# Patient Record
Sex: Female | Born: 1960 | Hispanic: No | Marital: Married | Smoking: Never smoker
Health system: Southern US, Community
[De-identification: ages and names within clinical notes are randomized; demographics above are authoritative.]

## PROBLEM LIST (undated history)

## (undated) DIAGNOSIS — I10 Essential (primary) hypertension: Secondary | ICD-10-CM

---

## 2014-01-14 ENCOUNTER — Emergency Department (HOSPITAL_COMMUNITY)
Admission: EM | Admit: 2014-01-14 | Discharge: 2014-01-15 | Disposition: A | Discharge status: Home / Self Care | Payer: BC Managed Care – PPO | Attending: Emergency Medicine | Admitting: Emergency Medicine

## 2014-01-14 ENCOUNTER — Encounter (HOSPITAL_COMMUNITY): Payer: Self-pay | Admitting: Emergency Medicine

## 2014-01-14 DIAGNOSIS — R1013 Epigastric pain: Secondary | ICD-10-CM | POA: Insufficient documentation

## 2014-01-14 DIAGNOSIS — Z880 Allergy status to penicillin: Secondary | ICD-10-CM | POA: Insufficient documentation

## 2014-01-14 DIAGNOSIS — R109 Unspecified abdominal pain: Secondary | ICD-10-CM

## 2014-01-14 DIAGNOSIS — I10 Essential (primary) hypertension: Secondary | ICD-10-CM | POA: Insufficient documentation

## 2014-01-14 DIAGNOSIS — R319 Hematuria, unspecified: Secondary | ICD-10-CM | POA: Insufficient documentation

## 2014-01-14 HISTORY — DX: Essential (primary) hypertension: I10

## 2014-01-14 NOTE — ED Notes (Signed)
Pt c/o L flank pain onset 1800 tonight, denies n/v, denies urinary s/s

## 2014-01-15 ENCOUNTER — Emergency Department (HOSPITAL_COMMUNITY): Payer: BC Managed Care – PPO

## 2014-01-15 LAB — BASIC METABOLIC PANEL
BUN: 22 mg/dL (ref 6–23)
CALCIUM: 9.3 mg/dL (ref 8.4–10.5)
CO2: 24 meq/L (ref 19–32)
CREATININE: 1.14 mg/dL — AB (ref 0.50–1.10)
Chloride: 100 mEq/L (ref 96–112)
GFR calc Af Amer: 62 mL/min — ABNORMAL LOW (ref >90)
GFR, EST NON AFRICAN AMERICAN: 54 mL/min — AB (ref >90)
GLUCOSE: 105 mg/dL — AB (ref 70–99)
Potassium: 3.6 mEq/L — ABNORMAL LOW (ref 3.7–5.3)
SODIUM: 138 meq/L (ref 137–147)

## 2014-01-15 LAB — URINALYSIS, ROUTINE W REFLEX MICROSCOPIC
BILIRUBIN URINE: NEGATIVE
Glucose, UA: NEGATIVE mg/dL
KETONES UR: NEGATIVE mg/dL
Leukocytes, UA: NEGATIVE
NITRITE: NEGATIVE
Protein, ur: 30 mg/dL — AB
Specific Gravity, Urine: 1.009 (ref 1.005–1.030)
UROBILINOGEN UA: 0.2 mg/dL (ref 0.0–1.0)
pH: 5.5 (ref 5.0–8.0)

## 2014-01-15 LAB — CBC
HCT: 36.3 % (ref 36.0–46.0)
Hemoglobin: 12.5 g/dL (ref 12.0–15.0)
MCH: 29 pg (ref 26.0–34.0)
MCHC: 34.4 g/dL (ref 30.0–36.0)
MCV: 84.2 fL (ref 78.0–100.0)
PLATELETS: 198 10*3/uL (ref 150–400)
RBC: 4.31 MIL/uL (ref 3.87–5.11)
RDW: 13 % (ref 11.5–15.5)
WBC: 8.3 10*3/uL (ref 4.0–10.5)

## 2014-01-15 LAB — URINE MICROSCOPIC-ADD ON

## 2014-01-15 LAB — LIPASE, BLOOD: LIPASE: 30 U/L (ref 11–59)

## 2014-01-15 LAB — TROPONIN I

## 2014-01-15 MED ORDER — SODIUM CHLORIDE 0.9 % IV BOLUS (SEPSIS)
1000.0000 mL | Freq: Once | INTRAVENOUS | Status: AC
  Administered 2014-01-15: 1000 mL via INTRAVENOUS

## 2014-01-15 MED ORDER — SODIUM CHLORIDE 0.9 % IV SOLN
80.0000 mg | Freq: Once | INTRAVENOUS | Status: AC
  Administered 2014-01-15: 80 mg via INTRAVENOUS
  Filled 2014-01-15: qty 80

## 2014-01-15 MED ORDER — MORPHINE SULFATE 4 MG/ML IJ SOLN
4.0000 mg | Freq: Once | INTRAMUSCULAR | Status: AC
  Administered 2014-01-15: 4 mg via INTRAVENOUS
  Filled 2014-01-15: qty 1

## 2014-01-15 MED ORDER — ASPIRIN 81 MG PO CHEW
324.0000 mg | CHEWABLE_TABLET | Freq: Once | ORAL | Status: AC
  Administered 2014-01-15: 324 mg via ORAL
  Filled 2014-01-15: qty 4

## 2014-01-15 NOTE — ED Notes (Signed)
EDP with patient at this time 

## 2014-01-15 NOTE — ED Notes (Signed)
Pt transported xray.  

## 2014-01-15 NOTE — ED Provider Notes (Signed)
Medical screening examination/treatment/procedure(s) were conducted as a shared visit with non-physician practitioner(s) or resident  and myself.  I personally evaluated the patient during the encounter and agree with the findings and plan unless otherwise indicated.    I have personally reviewed any xrays and/ or EKG's with the provider and I agree with interpretation.   Patient with worsening left flank pain since this evening, no history of similar, no history of kidney stones, C-section history. On exam patient has soft nontender abdomen with very mild left mid flank tenderness. CT scan done to look for kidney stone showed likely recent passage of stone. Pain controlled in ER.  Labs Reviewed  URINALYSIS, ROUTINE W REFLEX MICROSCOPIC - Abnormal; Notable for the following:    Hgb urine dipstick SMALL (*)    Protein, ur 30 (*)    All other components within normal limits  BASIC METABOLIC PANEL - Abnormal; Notable for the following:    Potassium 3.6 (*)    Glucose, Bld 105 (*)    Creatinine, Ser 1.14 (*)    GFR calc non Af Amer 54 (*)    GFR calc Af Amer 62 (*)    All other components within normal limits  URINE MICROSCOPIC-ADD ON - Abnormal; Notable for the following:    Squamous Epithelial / LPF FEW (*)    Bacteria, UA FEW (*)    All other components within normal limits  URINE CULTURE  CBC  LIPASE, BLOOD  TROPONIN I     Enid SkeensJoshua M Zavitz, MD 01/15/14 [phone]

## 2014-01-15 NOTE — ED Provider Notes (Signed)
CSN: [id number]     Arrival date & time 01/14/14  2334 History   First MD Initiated Contact with Patient 01/15/14 0022     Chief Complaint  Patient presents with  . Flank Pain     (Consider location/radiation/quality/duration/timing/severity/associated sxs/prior Treatment) Patient is a 53 y.o. female presenting with flank pain. The history is provided by the patient, medical records and a relative. No language interpreter was used.  Flank Pain Associated symptoms include abdominal pain and chest pain. Pertinent negatives include no coughing, diaphoresis, fatigue, fever, headaches, nausea, rash or vomiting.    Kerry Manning is a 53 y.o. female  with a hx of HTN presents to the Emergency Department complaining of gradual, persistent, progressively worsening left flank pain, epigastric pain, indigestion onset 6PM tonight.  Pt reports her pain is burning in nature, rated at a 6/10 and located mostly in the epigastrium without radiation. She took Prilosec PTA without relief.  No aggravating or alleviating factors. Pt denies fever, chills, headache, neck pain, SOB, N/V/D, weakness, dizziness, diaphoresis, syncope, dysuria, hematuria.     Past Medical History  Diagnosis Date  . Hypertension    Past Surgical History  Procedure Laterality Date  . Cesarean section     No family history on file. History  Substance Use Topics  . Smoking status: Never Smoker   . Smokeless tobacco: Not on file  . Alcohol Use: No   OB History   Grav Para Term Preterm Abortions TAB SAB Ect Mult Living                 Review of Systems  Constitutional: Negative for fever, diaphoresis, appetite change, fatigue and unexpected weight change.  HENT: Negative for mouth sores.   Eyes: Negative for visual disturbance.  Respiratory: Negative for cough, chest tightness, shortness of breath and wheezing.   Cardiovascular: Positive for chest pain.  Gastrointestinal: Positive for abdominal pain. Negative for  nausea, vomiting, diarrhea and constipation.  Endocrine: Negative for polydipsia, polyphagia and polyuria.  Genitourinary: Positive for flank pain. Negative for dysuria, urgency, frequency and hematuria.  Musculoskeletal: Negative for back pain and neck stiffness.  Skin: Negative for rash.  Allergic/Immunologic: Negative for immunocompromised state.  Neurological: Negative for syncope, light-headedness and headaches.  Hematological: Does not bruise/bleed easily.  Psychiatric/Behavioral: Negative for sleep disturbance. The patient is not nervous/anxious.       Allergies  Penicillins  Home Medications   Prior to Admission medications   Not on File   BP 113/67  Pulse 79  Temp(Src) 98.6 F (37 C) (Oral)  Resp 14  Wt 124 lb (56.246 kg)  SpO2 95% Physical Exam  Nursing note and vitals reviewed. Constitutional: She is oriented to person, place, and time. She appears well-developed and well-nourished. No distress.  Awake, alert, nontoxic appearance  HENT:  Head: Normocephalic and atraumatic.  Mouth/Throat: Oropharynx is clear and moist. No oropharyngeal exudate.  Eyes: Conjunctivae are normal. No scleral icterus.  Neck: Normal range of motion. Neck supple.  Cardiovascular: Normal rate, regular rhythm, normal heart sounds and intact distal pulses.   No murmur heard. Pulmonary/Chest: Effort normal and breath sounds normal. No respiratory distress. She has no wheezes.  Abdominal: Soft. Bowel sounds are normal. She exhibits no mass. There is tenderness in the epigastric area. There is CVA tenderness (bilateral). There is no rebound and no guarding.  Musculoskeletal: Normal range of motion. She exhibits no edema.  Neurological: She is alert and oriented to person, place, and time. Coordination normal.  Speech  is clear and goal oriented Moves extremities without ataxia  Skin: Skin is warm and dry. She is not diaphoretic. No erythema.  Psychiatric: She has a normal mood and affect.     ED Course  Procedures (including critical care time) Labs Review Labs Reviewed  URINALYSIS, ROUTINE W REFLEX MICROSCOPIC - Abnormal; Notable for the following:    Hgb urine dipstick SMALL (*)    Protein, ur 30 (*)    All other components within normal limits  BASIC METABOLIC PANEL - Abnormal; Notable for the following:    Potassium 3.6 (*)    Glucose, Bld 105 (*)    Creatinine, Ser 1.14 (*)    GFR calc non Af Amer 54 (*)    GFR calc Af Amer 62 (*)    All other components within normal limits  URINE MICROSCOPIC-ADD ON - Abnormal; Notable for the following:    Squamous Epithelial / LPF FEW (*)    Bacteria, UA FEW (*)    All other components within normal limits  URINE CULTURE  CBC  LIPASE, BLOOD  TROPONIN I    Imaging Review Ct Abdomen Pelvis Wo Contrast  01/15/2014   CLINICAL DATA:  Left flank pain  EXAM: CT ABDOMEN AND PELVIS WITHOUT CONTRAST  TECHNIQUE: Multidetector CT imaging of the abdomen and pelvis was performed following the standard protocol without IV contrast.  COMPARISON:  None.  FINDINGS: Renal: There is bilateral perinephric stranding. Small amount of fluid in the posterior perirenal space on the left (image 33 series 2 No hydronephrosis or hydroureter. No nephrolithiasis or ureterolithiasis. The  Lung bases are clear.  No pericardial fluid.  Low-density lesion in the high left hepatic lobe has simple fluid attenuation. The gallbladder, pancreas, spleen, adrenal glands are normal.  Stomach, small bowel, appendix, cecum are normal. The colon and rectosigmoid colon are normal.  Abdominal or is normal caliber. No retroperitoneal periportal lymphadenopathy.  No free fluid the pelvis. No distal ureteral stones or bladder stones. Multiple vascular calcifications are present within the pelvis. The uterus and ovaries are normal. No pelvic lymphadenopathy. No aggressive osseous lesion.  IMPRESSION: Bilateral perinephric stranding without hydronephrosis, hydroureter, or  ureterolithiasis is likely chronic. There is a small amount of fluid in the left posterior perirenal space therefore cannot exclude a passed stone on the left.   Electronically Signed   By: Genevive BiStewart  Edmunds M.D.   On: 01/15/2014 01:58   Dg Chest 2 View  01/15/2014   CLINICAL DATA:  Left-sided chest pain, abdominal pain  EXAM: CHEST  2 VIEW  COMPARISON:  None.  FINDINGS: Normal mediastinum and cardiac silhouette. Normal pulmonary vasculature. No evidence of effusion, infiltrate, or pneumothorax. No acute bony abnormality.  IMPRESSION: No acute cardiopulmonary process.   Electronically Signed   By: Genevive BiStewart  Edmunds M.D.   On: 01/15/2014 02:00     EKG Interpretation None      ECG:  Date: 01/15/2014  Rate: 81  Rhythm: normal sinus rhythm  QRS Axis: normal  Intervals: normal  ST/T Wave abnormalities: normal  Conduction Disutrbances:none  Narrative Interpretation: Nonischemic ECG, no old for comparison  Old EKG Reviewed: none available    MDM   Final diagnoses:  Flank pain  Hematuria  Epigastric pain   Kerry Manning presents with left flank pain, epigastric pain, indigestion and CP beginning at [employer]6PM tonight.  HEART score 3.    4:09 AM Patient with complete resolution of symptoms after treatment with aspirin morphine and Protonix.  Troponin normal, lipase normal. CBC without  leukocytosis and BMP with slightly elevated creatinine 1.14 without baseline for patient. Urine with small amount of hemoglobin noted no evidence of urinary tract infection.  Chest x-ray without pulmonary edema, pneumothorax or evidence of pneumonia. CT abdomen pelvis with small amount of fluid in the left posterior perirenal space likely secondary to a passed stone on the left.  This location of patient's initial pain.  Patient is to be discharged with recommendation to follow up with PCP in regards to today's hospital visit. Chest pain is not likely of cardiac or pulmonary etiology d/t presentation, PERC  negative, VSS, no tracheal deviation, no JVD or new murmur, RRR, breath sounds equal bilaterally, EKG without acute abnormalities, negative troponin, and negative CXR. Pt has been advised  to continue her  PPI and return to the ED if CP becomes exertional, associated with diaphoresis or nausea, radiates to left jaw/arm, worsens or becomes concerning in any way. Also recommend followup with urology for further discussion of nephrolithiasis. Pt appears reliable for follow up and is agreeable to discharge.   I have personally reviewed patient's vitals, nursing note and any pertinent labs or imaging.  I performed an undressed physical exam.    At this time, it has been determined that no acute conditions requiring further emergency intervention. The patient/guardian have been advised of the diagnosis and plan. I reviewed all labs and imaging including any potential incidental findings. We have discussed signs and symptoms that warrant return to the ED, such as those listed above .  Patient/guardian has voiced understanding and agreed to follow-up with the PCP or specialist in 3 days .  Vital signs are stable at discharge.   BP 113/67  Pulse 79  Temp(Src) 98.6 F (37 C) (Oral)  Resp 14  Wt 124 lb (56.246 kg)  SpO2 95%   Case has been discussed with and seen by Dr. Jodi MourningZavitz who agrees with the above plan to discharge.         Dierdre ForthHannah Muthersbaugh, PA-C 01/15/14 0413  Dierdre ForthHannah Muthersbaugh, PA-C 01/15/14 [id number]

## 2014-01-15 NOTE — Discharge Instructions (Signed)
1. Medications: usual home medications 2. Treatment: rest, drink plenty of fluids,  3. Follow Up: Please followup with your primary doctor in 3 days for discussion of your diagnoses and further evaluation after today's visit; if you do not have a primary care doctor use the resource guide provided to find one;    Clculos renales (Kidney Stones) Los clculos renales (urolitiasis) son masas slidas que se forman en el interior de los riones. El dolor intenso es causado por el movimiento de la piedra a travs del tracto urinario. Cuando la piedra se mueve, el urter hace un espasmo alrededor de la misma. El clculo generalmente se elimina con la [place].  CAUSAS   Un trastorno que hace que ciertas glndulas del cuello produzcan demasiada hormona paratiroidea (hiperparatiroidismo primario).  Una acumulacin de cristales de cido rico, similar a Brewing technologistla gota en las articulaciones.  Estrechamiento (constriccin) del urter.  Obstruccin en el rin presente al nacer (obstruccin congnita).  Cirugas previas del rin o los urteres.  Numerosas infecciones renales. SNTOMAS   Ganas de vomitar (nuseas).  Devolver la comida (vomitar).  Sangre en la orina (hematuria).  Dolor que generalmente se expande (irradia) hacia la ingle.  Ganas de orinar con frecuencia o de manera urgente. DIAGNSTICO   Historia clnica y examen fsico.  Anlisis de sangre y [place].  Tomografa computada.  En algunos casos se realiza un examen del interior de la vejiga (citoscopa). TRATAMIENTO   Observacin.  Aumentar la ingesta de lquidos.  Litotricia extracorprea con ondas de choque: es un procedimiento no invasivo que [place]util[place] ondas de cho[place] para romper los clculos renales.  Ser necesaria la ciruga si tiene dolor muy intenso o la obstruccin persiste. Hay varios procedimientos quirrgicos. La mayora de los procedimientos se realizan con el uso de pequeos instrumentos. Slo es Passenger transport managernecesario realizar  pequeas incisiones para acomodar estos instrumentos, por lo tanto el tiempo de recuperacin es mnimo. El tamao, la ubicacin y la composicin qumica de los clculos son variables importantes que determinarn la eleccin correcta de tratamiento para su caso. Comunquese con su mdico para comprender mejor su situacin, de modo que pueda ArvinMeritorminimizar los riesgos de lesiones para usted y su rin.  INSTRUCCIONES PARA EL CUIDADO EN EL HOGAR   Beba gran cantidad de lquido para mantener la orina de tono claro o color amarillo plido. Esto ayudar a eliminar las piedras o los fragmentos.  Cuele la orina con el colador que le han provisto. Guarde todas las partculas y piedras para que las vea el profesional que lo asiste. Puede ser tan pequea como un grano de sal. Es muy importante usar el colador cada vez que orine. La recoleccin de piedras permitir al [employer] analizar y Investment banker, operationalverificar que efectivamente ha eliminado una piedra. El anlisis de la piedra con frecuencia permitir identificar qu puede hacer para reducir la incidencia de las recurrencias.  Slo tome medicamentos de venta libre o recetados para Primary school teachercalmar el dolor, Environmental health practitionerel malestar o bajar la fiebre, segn las indicaciones de su mdico.  Cumpla con las citas de seguimiento tal como le indic el profesional que lo asiste.  Si se lo indica, hgase radiografas. La ausencia de dolor no siempre significa que las piedras se han eliminado. Puede ser que simplemente hayan dejado de moverse. Si el paso de orina permanece completamente obstruido, puede causar prdida de la funcin renal o simplemente la destruccin del rin. Es su responsabilidad Chartered certified accountantcompletar el seguimiento y las radiografas. Las ecografas del rin pueden mostrar una obstruccin y [place] estado del  rin. Las ecografas no se asocian con la radiacin y pueden realizarse fcilmente en cuestin de minutos. SOLICITE ATENCIN MDICA SI:  Siente dolor que no responde a los Administratoranalgsicos que le  recetaron. SOLICITE ATENCIN MDICA DE INMEDIATO SI:   No puede controlar el dolor con los medicamentos que le han recetado.  Siente escalofros o fiebre.  La gravedad o la intensidad del dolor aumenta durante 18 horas y no se Chief Executive Officeralivia con los analgsicos.  Presenta un nuevo episodio de dolor abdominal.  Sufre mareos o se desmaya.  No puede orinar. ASEGRESE DE QUE:   Comprende estas instrucciones.  Controlar su afeccin.  Recibir ayuda de inmediato si no mejora o si empeora. Document Released: 07/15/2005 Document Revised: 03/17/2013 [facility]Exit[facility] Patient Information 2015 [place], [place]. This information is not intended to replace advice given to you by your health care provider. Make sure you discuss any questions you have with your health care provider.

## 2014-01-16 LAB — URINE CULTURE
COLONY COUNT: NO GROWTH
CULTURE: NO GROWTH
SPECIAL REQUESTS: NORMAL

## 2015-10-21 IMAGING — CR DG CHEST 2V
2 series · 2 of 2 positions shown · non-contrast
Comparison: None.

CLINICAL DATA: Left-sided chest pain, abdominal pain

EXAM:
CHEST  2 VIEW

[w chest pa]
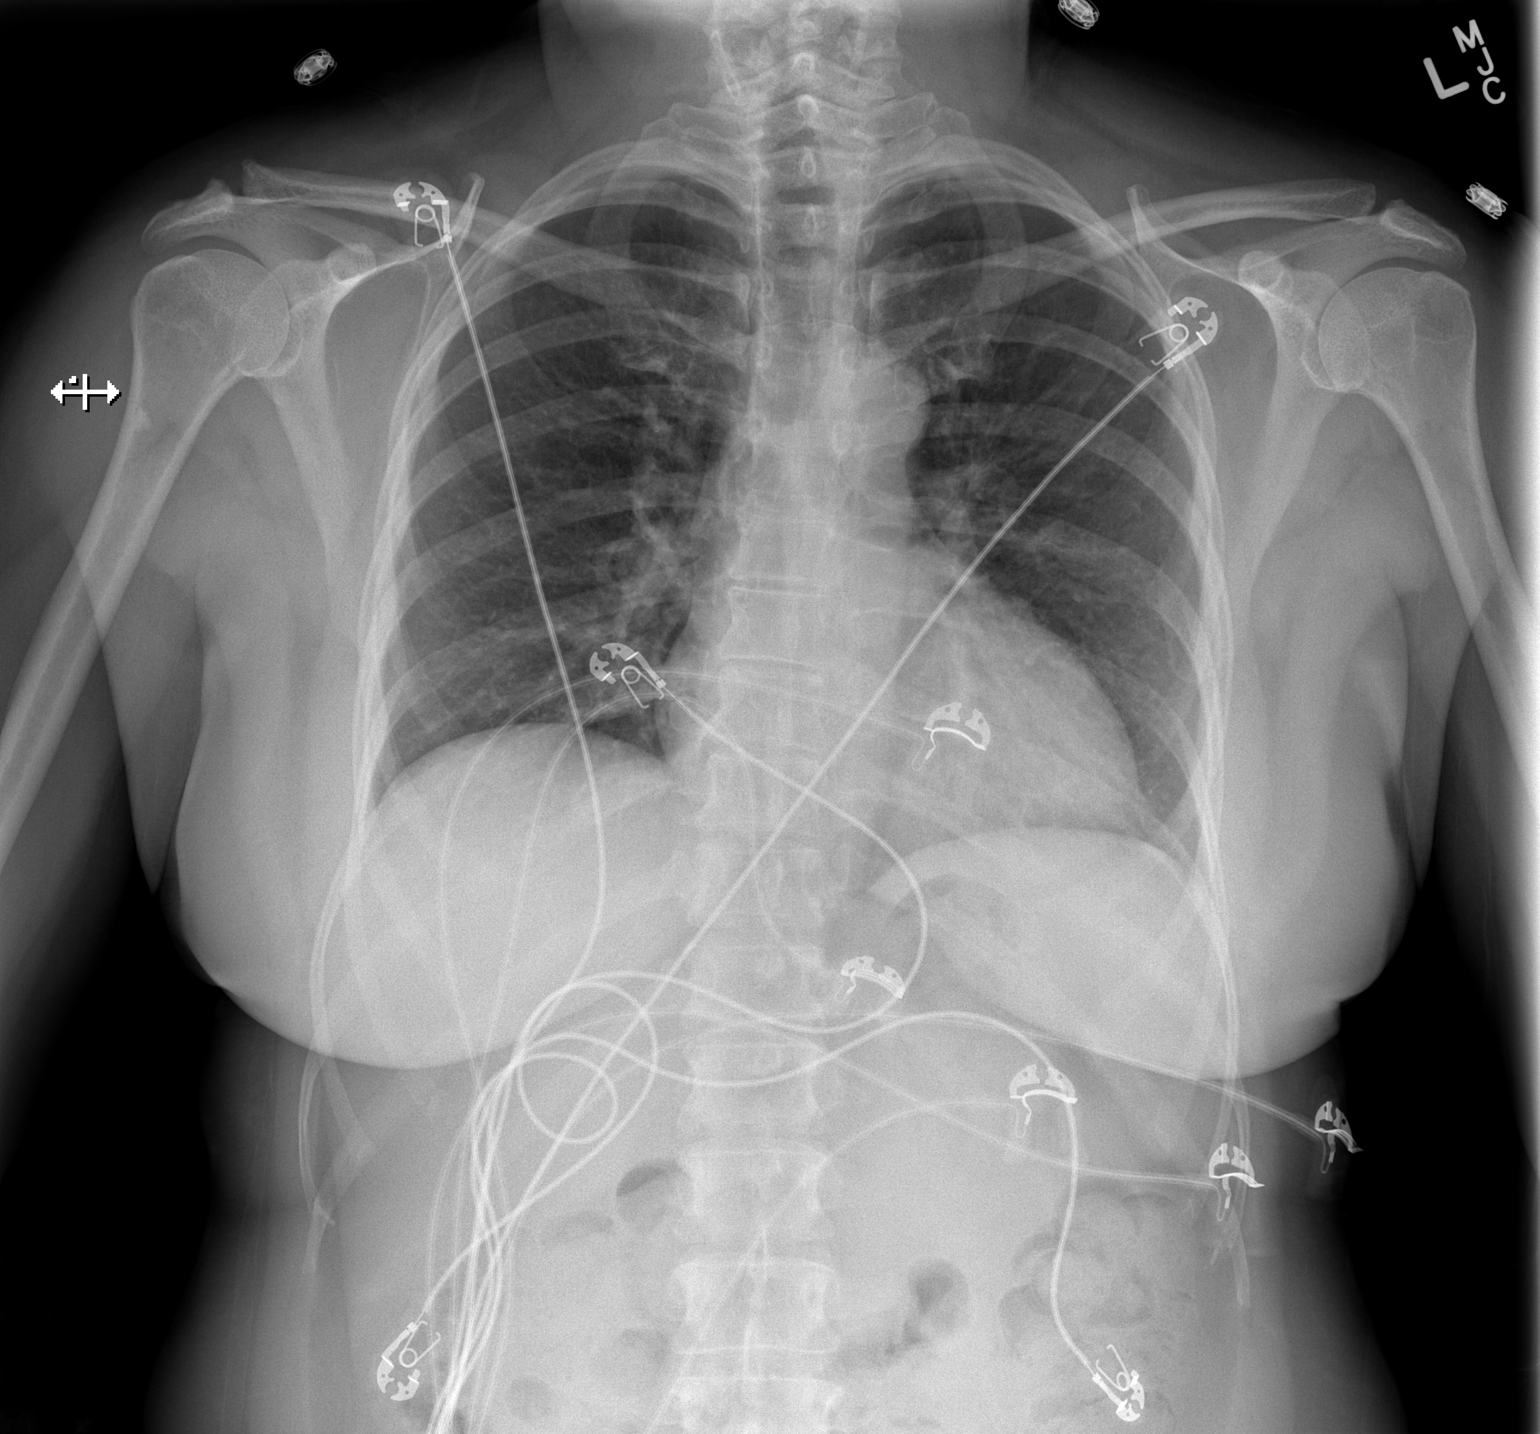

[w chest lat]
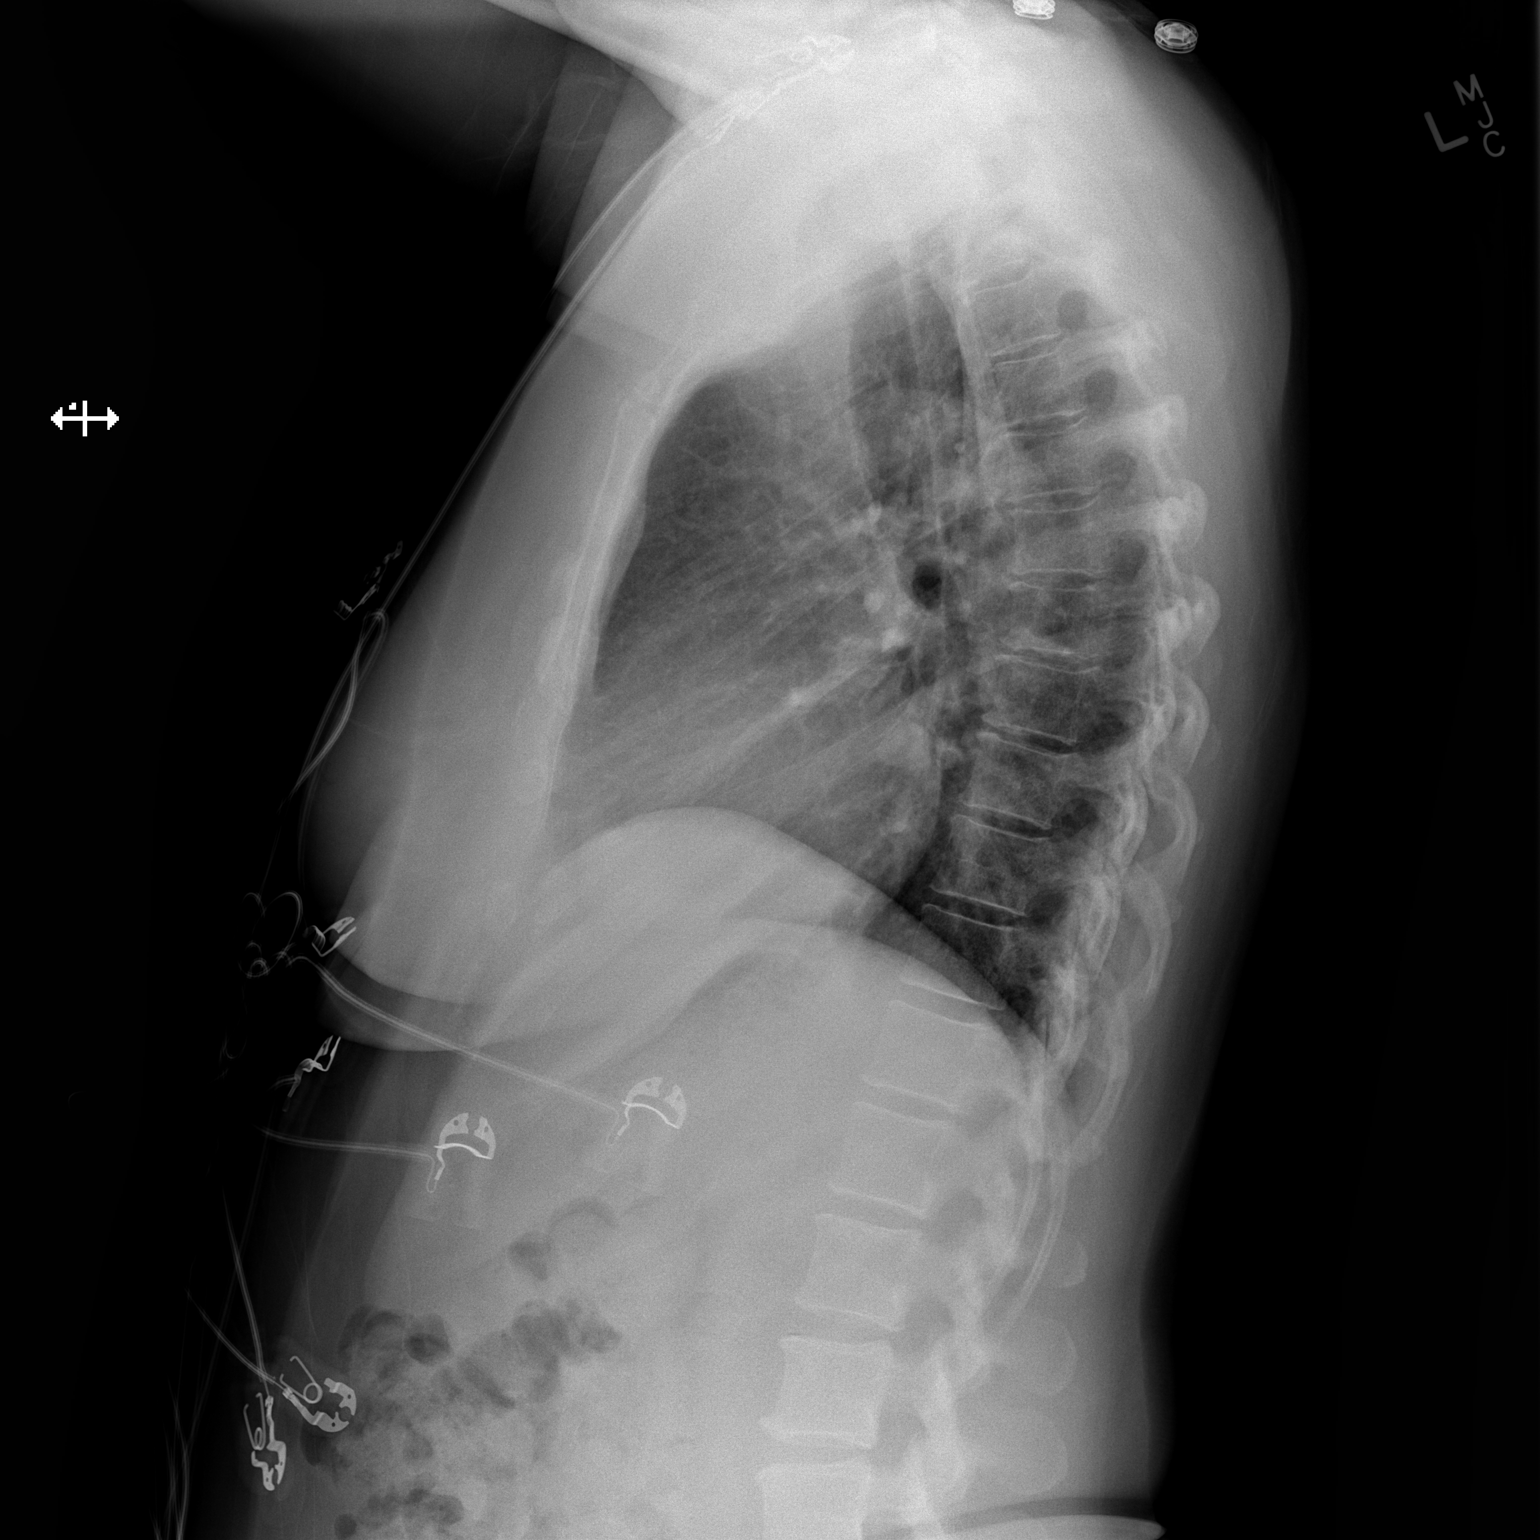

[2 of 2 positions shown; findings below may reference images not displayed]

FINDINGS: Normal mediastinum and cardiac silhouette. Normal pulmonary
vasculature. No evidence of effusion, infiltrate, or pneumothorax.
No acute bony abnormality.
IMPRESSION: No acute cardiopulmonary process.

## 2015-10-21 IMAGING — CT CT ABD-PELV W/O CM
1 series · 15 of 23 positions shown, 19 images · non-contrast
Comparison: None.

CLINICAL DATA: Left flank pain

EXAM:
CT ABDOMEN AND PELVIS WITHOUT CONTRAST
TECHNIQUE: Multidetector CT imaging of the abdomen and pelvis was performed
following the standard protocol without IV contrast.

[Series 4: lung · axial · 0.74mm/px · z∈[-178,-78]mm · 15 of 23 slices shown, 19 images]
[im 2/23  soft-tissue]
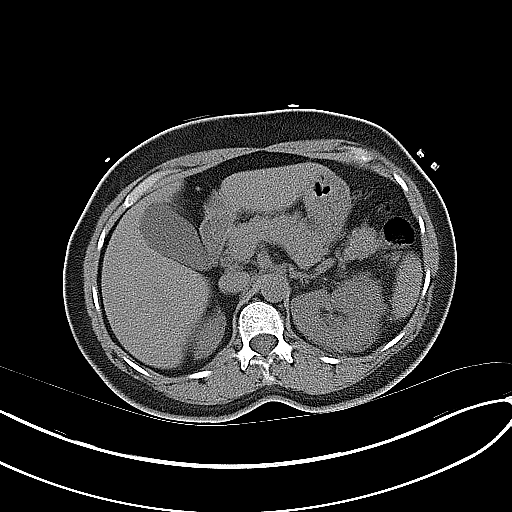
[im 2/23  bone]
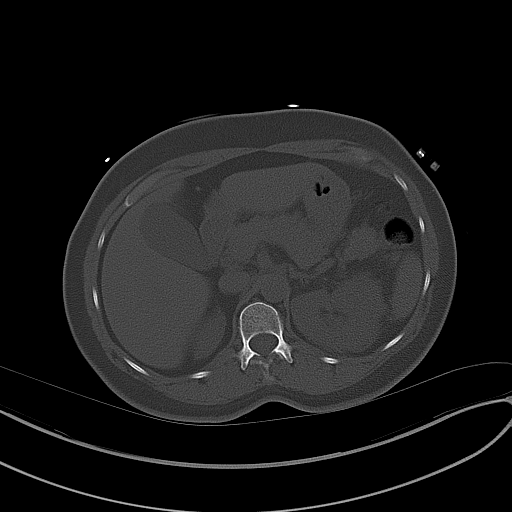
[im 4/23  soft-tissue]
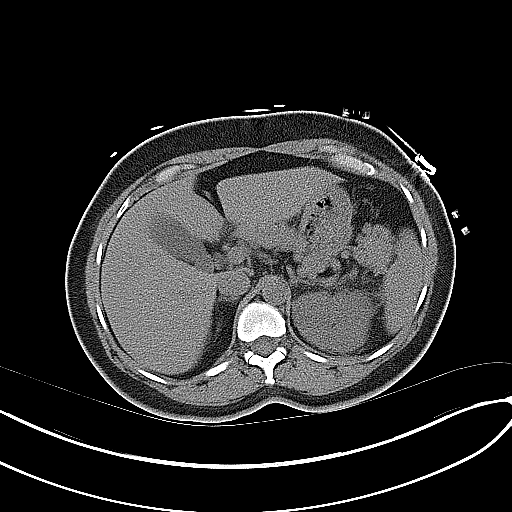
[im 6/23  soft-tissue]
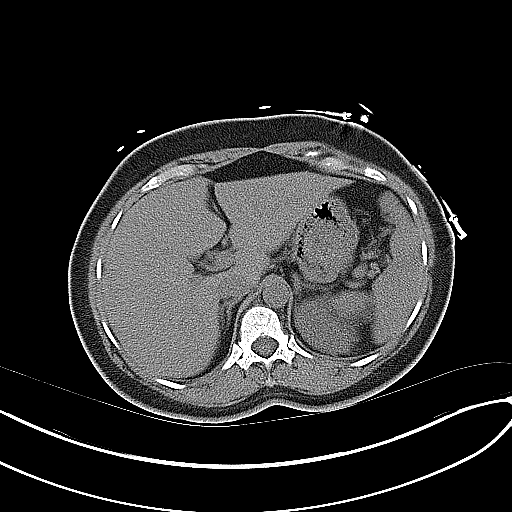
[im 7/23  soft-tissue]
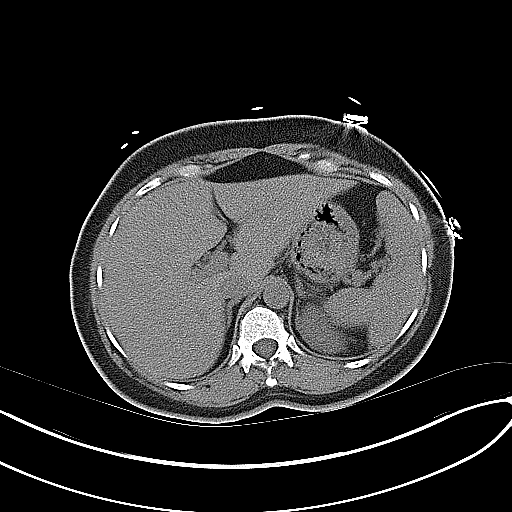
[im 9/23  soft-tissue]
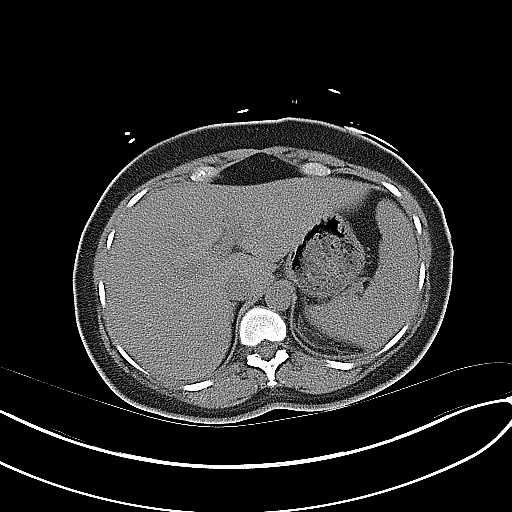
[im 10/23  soft-tissue]
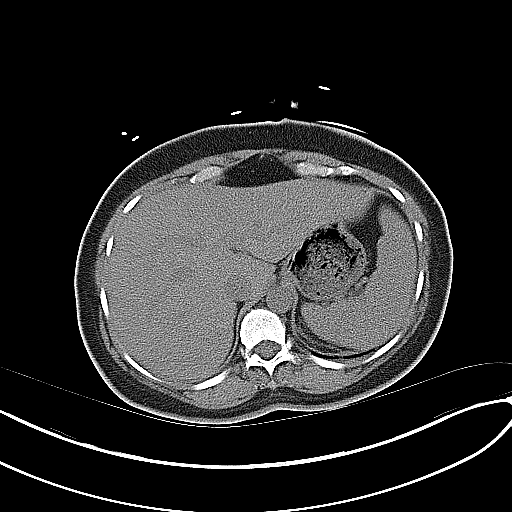
[im 12/23  soft-tissue]
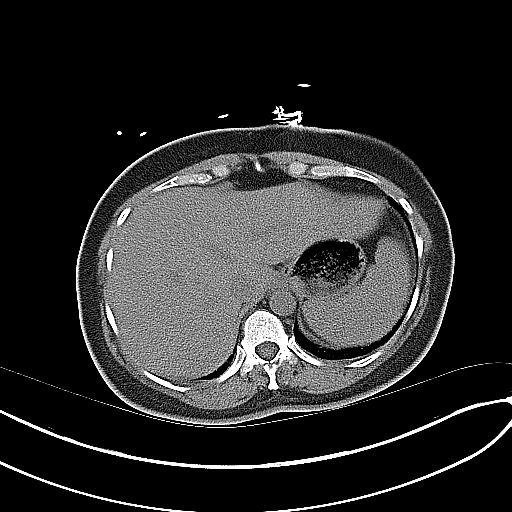
[im 14/23  soft-tissue]
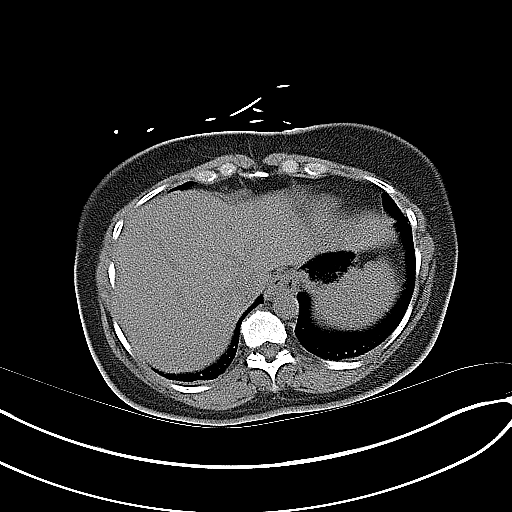
[im 15/23  soft-tissue]
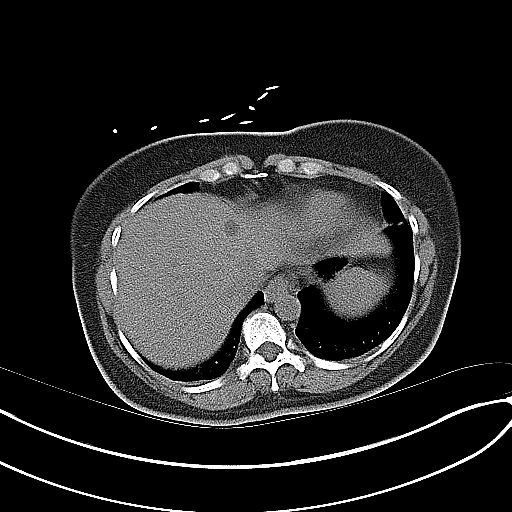
[im 15/23  bone]
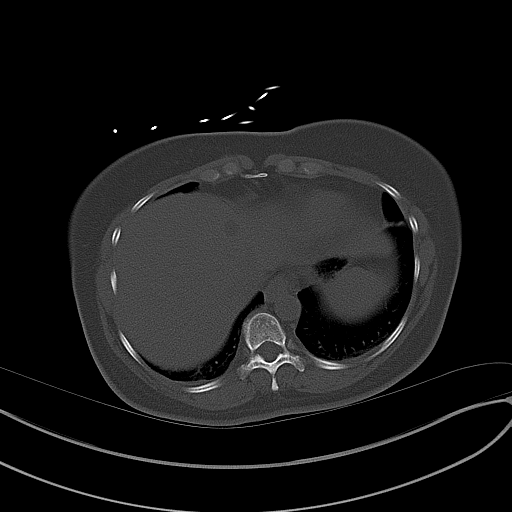
[im 17/23  soft-tissue]
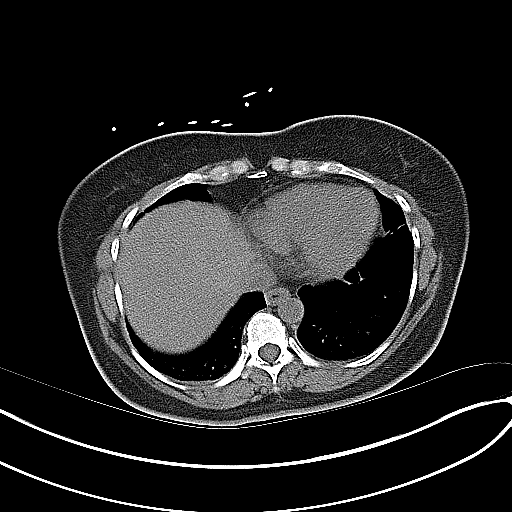
[im 18/23  soft-tissue]
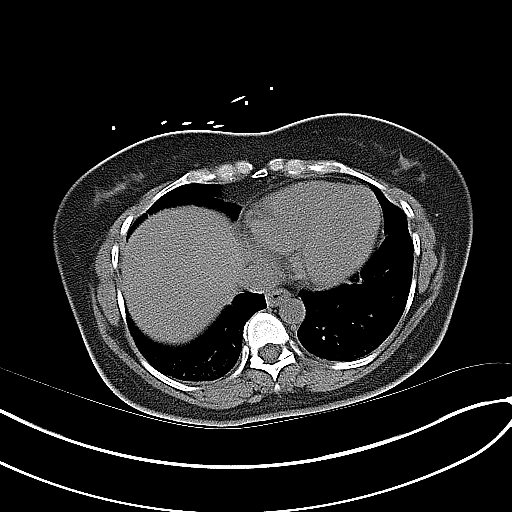
[im 19/23  lung]
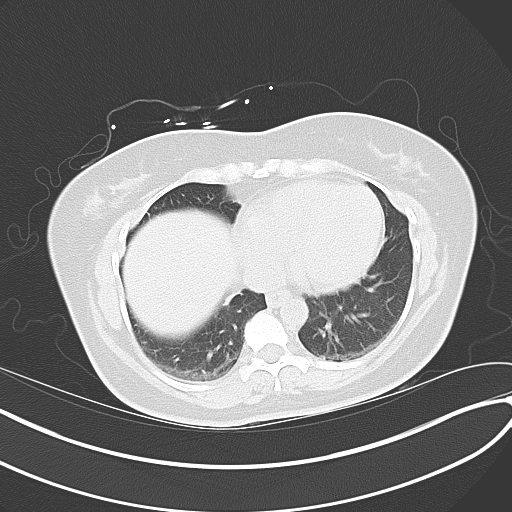
[im 20/23  soft-tissue]
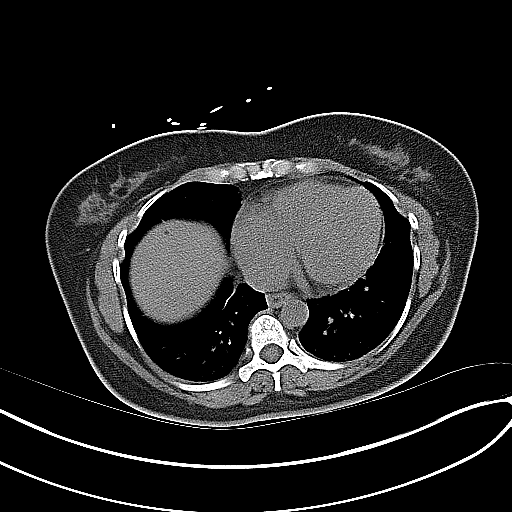
[im 20/23  lung]
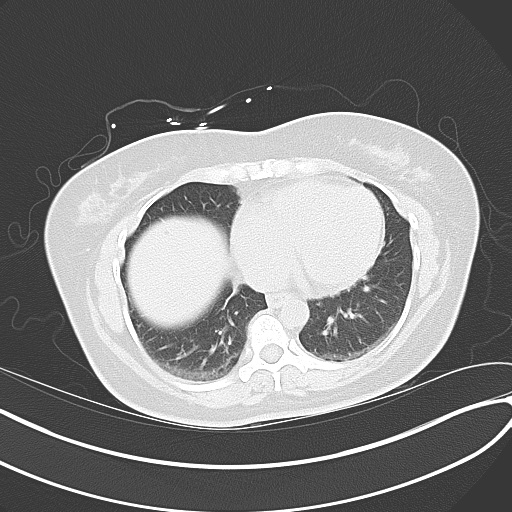
[im 21/23  lung]
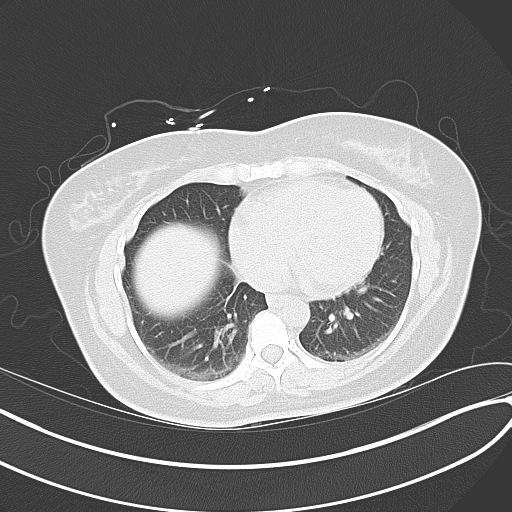
[im 22/23  soft-tissue]
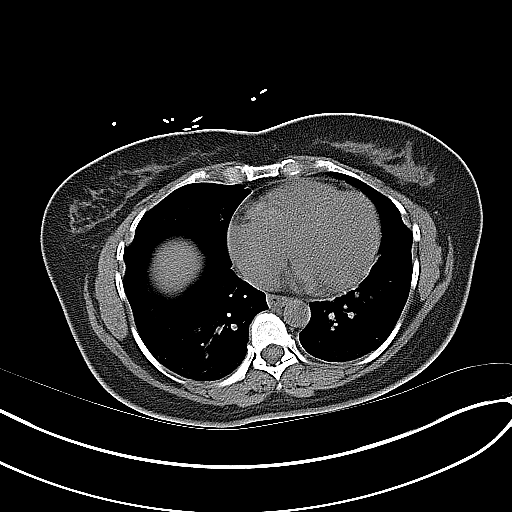
[im 22/23  lung]
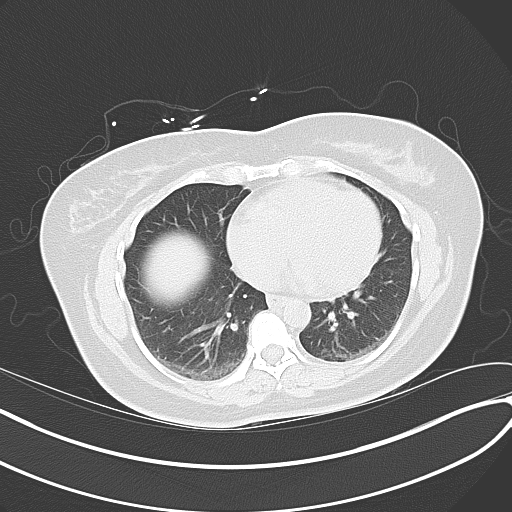

[15 of 23 positions shown; findings below may reference images not displayed]

FINDINGS: Renal: There is bilateral perinephric stranding. Small amount of
fluid in the posterior perirenal space on the left (image 33 series
2 No hydronephrosis or hydroureter. No nephrolithiasis or
ureterolithiasis. The

Lung bases are clear.  No pericardial fluid.

Low-density lesion in the high left hepatic lobe has simple fluid
attenuation. The gallbladder, pancreas, spleen, adrenal glands are
normal.

Stomach, small bowel, appendix, cecum are normal. The colon and
rectosigmoid colon are normal.

Abdominal or is normal caliber. No retroperitoneal periportal
lymphadenopathy.

No free fluid the pelvis. No distal ureteral stones or bladder
stones. Multiple vascular calcifications are present within the
pelvis. The uterus and ovaries are normal. No pelvic
lymphadenopathy. No aggressive osseous lesion.
IMPRESSION: Bilateral perinephric stranding without hydronephrosis, hydroureter,
or ureterolithiasis is likely chronic. There is a small amount of
fluid in the left posterior perirenal space therefore cannot exclude
a passed stone on the left.
